# Patient Record
Sex: Female | Born: 1960 | Race: White | Hispanic: No | Smoking: Current some day smoker
Health system: Southern US, Community
[De-identification: ages and names within clinical notes are randomized; demographics above are authoritative.]

## PROBLEM LIST (undated history)

## (undated) DIAGNOSIS — I10 Essential (primary) hypertension: Secondary | ICD-10-CM

## (undated) HISTORY — PX: ABDOMINAL HYSTERECTOMY: SHX81

## (undated) HISTORY — PX: AUGMENTATION MAMMAPLASTY: SUR837

---

## 2005-04-19 ENCOUNTER — Emergency Department: Payer: Self-pay | Admitting: Emergency Medicine

## 2006-04-23 ENCOUNTER — Emergency Department: Payer: Self-pay | Admitting: Unknown Physician Specialty

## 2006-04-24 ENCOUNTER — Emergency Department (HOSPITAL_COMMUNITY): Admission: EM | Admit: 2006-04-24 | Discharge: 2006-04-24 | Payer: Self-pay | Admitting: Emergency Medicine

## 2007-02-22 ENCOUNTER — Emergency Department (HOSPITAL_COMMUNITY): Admission: EM | Admit: 2007-02-22 | Discharge: 2007-02-22 | Payer: Self-pay | Admitting: Emergency Medicine

## 2007-02-22 ENCOUNTER — Emergency Department: Payer: Self-pay | Admitting: Unknown Physician Specialty

## 2007-07-01 ENCOUNTER — Emergency Department: Payer: Self-pay | Admitting: Emergency Medicine

## 2007-09-06 ENCOUNTER — Emergency Department (HOSPITAL_COMMUNITY): Admission: EM | Admit: 2007-09-06 | Discharge: 2007-09-06 | Payer: Self-pay | Admitting: *Deleted

## 2008-04-01 ENCOUNTER — Emergency Department: Payer: Self-pay | Admitting: Emergency Medicine

## 2008-08-04 ENCOUNTER — Ambulatory Visit: Payer: Self-pay | Admitting: Gastroenterology

## 2008-08-24 ENCOUNTER — Ambulatory Visit: Payer: Self-pay | Admitting: Surgery

## 2008-08-28 ENCOUNTER — Ambulatory Visit: Payer: Self-pay | Admitting: Surgery

## 2009-01-21 ENCOUNTER — Ambulatory Visit: Payer: Self-pay | Admitting: Family Medicine

## 2010-02-25 ENCOUNTER — Inpatient Hospital Stay (HOSPITAL_COMMUNITY): Admission: EM | Admit: 2010-02-25 | Discharge: 2010-02-26 | Payer: Self-pay | Source: Home / Self Care

## 2010-05-16 LAB — CBC
Hemoglobin: 11.5 g/dL — ABNORMAL LOW (ref 12.0–15.0)
MCH: 32.8 pg (ref 26.0–34.0)
MCHC: 32.7 g/dL (ref 30.0–36.0)
MCHC: 34.4 g/dL (ref 30.0–36.0)
MCV: 98 fL (ref 78.0–100.0)
Platelets: 223 10*3/uL (ref 150–400)
Platelets: 289 10*3/uL (ref 150–400)
RBC: 3.56 MIL/uL — ABNORMAL LOW (ref 3.87–5.11)
RDW: 12.9 % (ref 11.5–15.5)
RDW: 13.2 % (ref 11.5–15.5)
WBC: 13.5 10*3/uL — ABNORMAL HIGH (ref 4.0–10.5)
WBC: 21.6 10*3/uL — ABNORMAL HIGH (ref 4.0–10.5)

## 2010-05-16 LAB — DIFFERENTIAL
Basophils Absolute: 0 10*3/uL (ref 0.0–0.1)
Basophils Absolute: 0 10*3/uL (ref 0.0–0.1)
Basophils Relative: 0 % (ref 0–1)
Eosinophils Relative: 0 % (ref 0–5)
Lymphs Abs: 2.6 10*3/uL (ref 0.7–4.0)
Monocytes Absolute: 1.8 10*3/uL — ABNORMAL HIGH (ref 0.1–1.0)
Monocytes Relative: 7 % (ref 3–12)
Monocytes Relative: 8 % (ref 3–12)
Neutro Abs: 17.1 10*3/uL — ABNORMAL HIGH (ref 1.7–7.7)
Neutro Abs: 8.6 10*3/uL — ABNORMAL HIGH (ref 1.7–7.7)
Neutrophils Relative %: 64 % (ref 43–77)

## 2010-05-16 LAB — URINALYSIS, ROUTINE W REFLEX MICROSCOPIC
Glucose, UA: NEGATIVE mg/dL
Ketones, ur: NEGATIVE mg/dL
Leukocytes, UA: NEGATIVE
Nitrite: NEGATIVE
Specific Gravity, Urine: 1.023 (ref 1.005–1.030)
Urobilinogen, UA: 0.2 mg/dL (ref 0.0–1.0)

## 2010-05-16 LAB — WOUND CULTURE

## 2010-05-16 LAB — POCT I-STAT, CHEM 8
Creatinine, Ser: 0.9 mg/dL (ref 0.4–1.2)
HCT: 46 % (ref 36.0–46.0)
Hemoglobin: 15.6 g/dL — ABNORMAL HIGH (ref 12.0–15.0)
Potassium: 3.6 mEq/L (ref 3.5–5.1)

## 2010-05-16 LAB — ANAEROBIC CULTURE

## 2010-05-16 LAB — URINE CULTURE: Culture  Setup Time: 201112231402

## 2010-05-16 LAB — POCT CARDIAC MARKERS
CKMB, poc: <1 ng/mL — ABNORMAL LOW (ref 1.0–8.0)
Myoglobin, poc: 50.9 ng/mL (ref 12–200)
Troponin i, poc: <0.05 ng/mL (ref 0.00–0.09)

## 2010-05-16 LAB — URINE MICROSCOPIC-ADD ON

## 2010-07-23 ENCOUNTER — Emergency Department (HOSPITAL_COMMUNITY)
Admission: EM | Admit: 2010-07-23 | Discharge: 2010-07-24 | Discharge status: Against Medical Advice | Payer: PRIVATE HEALTH INSURANCE | Attending: Emergency Medicine | Admitting: Emergency Medicine

## 2010-07-23 DIAGNOSIS — M542 Cervicalgia: Secondary | ICD-10-CM | POA: Insufficient documentation

## 2010-07-23 DIAGNOSIS — M25519 Pain in unspecified shoulder: Secondary | ICD-10-CM | POA: Insufficient documentation

## 2010-07-23 DIAGNOSIS — M549 Dorsalgia, unspecified: Secondary | ICD-10-CM | POA: Insufficient documentation

## 2010-07-23 DIAGNOSIS — R112 Nausea with vomiting, unspecified: Secondary | ICD-10-CM | POA: Insufficient documentation

## 2010-08-09 ENCOUNTER — Emergency Department: Payer: Self-pay | Admitting: Emergency Medicine

## 2010-11-13 ENCOUNTER — Emergency Department (HOSPITAL_COMMUNITY): Payer: PRIVATE HEALTH INSURANCE

## 2010-11-13 ENCOUNTER — Observation Stay (HOSPITAL_COMMUNITY)
Admission: EM | Admit: 2010-11-13 | Discharge: 2010-11-14 | Disposition: A | Discharge status: Home / Self Care | Payer: PRIVATE HEALTH INSURANCE | Attending: Surgery | Admitting: Surgery

## 2010-11-13 DIAGNOSIS — K612 Anorectal abscess: Principal | ICD-10-CM | POA: Insufficient documentation

## 2010-11-13 DIAGNOSIS — D696 Thrombocytopenia, unspecified: Secondary | ICD-10-CM | POA: Insufficient documentation

## 2010-11-13 DIAGNOSIS — F17200 Nicotine dependence, unspecified, uncomplicated: Secondary | ICD-10-CM | POA: Insufficient documentation

## 2010-11-13 LAB — COMPREHENSIVE METABOLIC PANEL
ALT: 19 U/L (ref 0–35)
Alkaline Phosphatase: 107 U/L (ref 39–117)
CO2: 26 mEq/L (ref 19–32)
Creatinine, Ser: 0.64 mg/dL (ref 0.50–1.10)
GFR calc Af Amer: >60 mL/min (ref >60)
GFR calc non Af Amer: >60 mL/min (ref >60)
Glucose, Bld: 87 mg/dL (ref 70–99)
Sodium: 136 mEq/L (ref 135–145)
Total Bilirubin: 0.3 mg/dL (ref 0.3–1.2)

## 2010-11-13 LAB — DIFFERENTIAL
Eosinophils Relative: 0 % (ref 0–5)
Lymphocytes Relative: 9 % — ABNORMAL LOW (ref 12–46)
Lymphs Abs: 1.7 10*3/uL (ref 0.7–4.0)
Monocytes Absolute: 1.2 10*3/uL — ABNORMAL HIGH (ref 0.1–1.0)
Neutro Abs: 15.5 10*3/uL — ABNORMAL HIGH (ref 1.7–7.7)
Neutrophils Relative %: 84 % — ABNORMAL HIGH (ref 43–77)

## 2010-11-13 LAB — URINALYSIS, ROUTINE W REFLEX MICROSCOPIC
Glucose, UA: NEGATIVE mg/dL
Hgb urine dipstick: NEGATIVE
Leukocytes, UA: NEGATIVE
Nitrite: NEGATIVE
Specific Gravity, Urine: 1.009 (ref 1.005–1.030)
Urobilinogen, UA: 0.2 mg/dL (ref 0.0–1.0)

## 2010-11-13 LAB — CBC
MCHC: 34.4 g/dL (ref 30.0–36.0)
MCV: 95.2 fL (ref 78.0–100.0)
Platelets: 137 10*3/uL — ABNORMAL LOW (ref 150–400)
RBC: 4.55 MIL/uL (ref 3.87–5.11)
WBC: 18.4 10*3/uL — ABNORMAL HIGH (ref 4.0–10.5)

## 2010-11-13 LAB — LIPASE, BLOOD: Lipase: 37 U/L (ref 11–59)

## 2010-11-13 LAB — POCT PREGNANCY, URINE: Preg Test, Ur: NEGATIVE

## 2010-11-13 LAB — PROTIME-INR: INR: 0.95 (ref 0.00–1.49)

## 2010-11-13 MED ORDER — IOHEXOL 300 MG/ML  SOLN
100.0000 mL | Freq: Once | INTRAMUSCULAR | Status: AC | PRN
  Administered 2010-11-13: 100 mL via INTRAVENOUS

## 2010-11-21 NOTE — H&P (Signed)
NAMECINDIE, RAJAGOPALAN NO.:  [id number]  MEDICAL RECORD NO.:  [id number]  LOCATION:  1525                         FACILITY:  [facility]  PHYSICIAN:  Sandria Bales. Ezzard Standing, M.D.  DATE OF BIRTH:  24-Jul-1960  DATE OF ADMISSION:  11/13/2010                             HISTORY & PHYSICAL   REFERRING PHYSICIAN:  Glynn Octave, MD  REASON FOR REFERRAL:  Perirectal abscess.  HISTORY OF PRESENT ILLNESS:  This is a 50 year old white female who has no identified primary medical doctor who has had increasing rectal pain.  Per history, she had a stapled hemorrhoidopexy in mid-2011 in [place].  She is unsure of the surgeon who did the surgery.  She has had chronic hemorrhoid problems before then, but has done much better after the hemorrhoidopexy.  She then developed a perirectal abscess, which was drained by Dr. Claud Kelp on February 25, 2010.  She did well with that abscess drainage until about 5 days ago when she had some loose stools.  Over the last 24 hours, she has had increasing rectal pain and discomfort and some tenesmus.  She has no history of peptic ulcer disease, liver disease, pancreatic disease, or colon disease.  Around 2006 or 2007, she was in [place]. They tried to do a colonoscopy on her because of trouble with the hemorrhoids, but she never really completed this and has not had any followup.  She has no history of Crohn disease or colitis.  PAST MEDICAL HISTORY:  She is allergic to CODEINE and HYDROCODONE.  She is not on any medicines right now.  REVIEW OF SYSTEMS:   NEUROLOGIC:  She has had seizures, she is not on any meds right now, she says her last seizure when she was 26, so it was 15 years ago. She does not know the cause of her seizures, she says she was young and stressed.  There has been no specific diagnosis. She is not seeing a neurologist.   CARDIAC:  She has no heart disease, chest pain, or hypertension.   PULMONARY:  She smokes about a  pack of cigarettes a day, knows it is bad for health, and was trying to quit before. She has no chronic lung disease.   GASTROINTESTINAL:  See history of present illness.   UROLOGIC:  No kidney stones or  kidney infections. MUSCULOSKELETAL:  She had some left elbow surgery in August 2011.  She has a cyst on her right elbow.   ENDOCRINE:  She has no history of thyroid or diabetes.  PERSONAL AND SOCIAL HISTORY:  She is accompanied by her husband.  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  She has 2 children, ages 28 and 16.  PHYSICAL EXAMINATION:  VITAL SIGNS:  Her temperature is 99.5, blood pressure 120/79, pulse is 80. GENERAL:  She is a thin, well-nourished white female who is alert, actually does not look that uncomfortable. HEENT:  Unremarkable. NECK:  Supple.  I feel no thyromegaly. LYMPH NODE:  She has no cervical or supraclavicular adenopathy. LUNGS:  Clear to auscultation with symmetric breath sounds. HEART:  Regular rate and rhythm.  I do not hear a murmur. ABDOMEN:  Soft.  She has a scar  in her infraumbilical area.  She says she has had prior ovarian cyst and has undergone at least 1 laparoscopy in the past to look at this, but it has all been for benign disease. RECTUM:  I am able to do a rectal exam, but she has a fullness along her right anterior rectum and is tender, so it really would not be practical to try to drain in the ER.  I think I will have to have her supine to actually get into rectum and look well. NEUROLOGIC:  She is intact to motor and sensory function and has good strength in the upper and lower extremities.  LABORATORY DATA:  She has a white blood count of 18,400, hemoglobin of 14.9, hematocrit 43, a platelet count is 137,000.  Her sodium is 136, potassium 3.9, chloride of 100, CO2 of 26, glucose of 87, BUN 11, creatinine 0.64, lipase is 37.  Her PT was 12.9 with an INR of 0.95. Her urinalysis was negative.  IMPRESSION: 1. Perirectal abscess,  which I think is too far up in her rectal canal     to do at the bedside, so I will take her to the operating room     under general anesthesia.  Interestingly, this is      right beside or adjacent to her staple line for a stapled     hemorrhoidopex.   It is now a second event in the same area, so I do     not know if the staples are acting as a foreign body, causing the     recurrence of abscesses.  I have talked to her that I can try to     find out the surgeon who did her surgery and go back to see them.     I discussed with her the indications, potential complications of     surgery.  Potential complications include but not limited to     bleeding, injury to rectum and recurrence of the abscess. 2. History of seizures, but she is not on active medicine.  She says     she has not had seizures in the last 15 years. 3. Smokes cigarettes, knows it is bad for health. 4. Mild thrombocytopenia on initial CBC.     Sandria Bales. Ezzard Standing, M.D.     DHN/MEDQ  D:  11/13/2010  T:  11/14/2010  Job:  [id number]  Electronically Signed by Ovidio Kin M.D. on 11/21/2010 11:53:42 AM

## 2010-11-21 NOTE — Op Note (Signed)
NAMECLARISSIA, MCKEEN NO.:  [id number]  MEDICAL RECORD NO.:  [id number]  LOCATION:  1525                         FACILITY:  [facility]  PHYSICIAN:  Sandria Bales. Ezzard Standing, M.D.  DATE OF BIRTH:  [birth year]  DATE OF PROCEDURE:  11/13/2010                              OPERATIVE REPORT   PREOPERATIVE DIAGNOSIS:  Perirectal abscess.  POSTOPERATIVE DIAGNOSIS:  Perirectal abscess at staple line of prior hemorrhoid surgery.  PROCEDURE PERFORMED:  Exam under anesthesia with incision and drainage of perirectal abscess, right anterior location.  SURGEON:  Sandria Bales. Ezzard Standing, M.D.  FIRST ASSISTANT:  None.  ANESTHESIA:  General endotracheal.  ESTIMATED BLOOD LOSS:  Minimal.  INDICATION FOR PROCEDURE:  The patient is a 50 year old white female who has no identified primary medical doctor.  She had a stapled sigmoidopexy in 2011 in [place].  She do not know the name of the surgeon who did the surgery.    She presented on 02/25/10 with a perirectal abscess which was incised and drained by Dr. Claud Kelp. She has done well until the last few days when she had again some rectal pain and tenesmus.  She presented to the [facility] Emergency Room where she had a leukocytosis and a CT scan showing a recurrent abscess in the right anterior rectum. (this is a similar location as to the abscess that Dr. Derrell Lolling drained)  I discussed with the patient proceeding with surgery for this abscess. I discussed the indications and potential risk.  The potential risks include bleeding, incontinence, and recurrent abscess.  OPERATIVE NOTE:  The patient was placed in the lithotomy position, supervised by the Dr. Sherrian Divers in room #1, she was given a gram of cefoxitin at the initiation of the procedure.  Her abdomen and perineum was prepped with a Betadine solution and sterilely draped.    A time-out was held and surgical checklist run.  I tried to do a rectal ultrasound to see if I  could visualize the staple line or the abscess, but I never got a good contact with the rectal sidewalls and therefore I was not able to obtain good images.  I then did an anoscopy and saw the staple line, it was about 2 cm above the pectinate line.  At about the 10 o'clock position on the staple line with the patient in lithotomy position, I could see pus coming out at the staple line and this was consistent with the abscess location.  I tried to open this with hemostat, but was not very successfull.  I used my finger to  push into the abscess; she has had somewhat thick scar over the staple line, opened about a centimeter half area and drained the abscess.  I irrigated the wound with saline, it is right now about 10 p.m. I will keep her overnight on intravenous antibiotics with probable plan to discharge tomorrow.  I did remove at least one staple I thought within the cavity, I wondered this was nidus of recurrent infection.  She toleratd the procedure well and was transferred to the recovery room in good condition.   Sandria Bales. Ezzard Standing, M.D., FACS   DHN/MEDQ  D:  11/13/2010  T:  11/14/2010  Job:  [id number]  Electronically Signed by Ovidio Kin M.D. on 11/21/2010 11:48:58 AM

## 2010-12-09 LAB — DIFFERENTIAL
Eosinophils Absolute: 0.2
Lymphs Abs: 3.3
Neutrophils Relative %: 51

## 2010-12-09 LAB — CBC: HCT: 41.9

## 2010-12-26 NOTE — Discharge Summary (Signed)
  NAMEANARA, COWMAN NO.:  [id number]  MEDICAL RECORD NO.:  [id number]  LOCATION:  1525                         FACILITY:  [facility]  PHYSICIAN:  Sandria Bales. Ezzard Standing, M.D.  DATE OF BIRTH:  1960/07/06  DATE OF ADMISSION:  11/13/2010 DATE OF DISCHARGE:  11/14/2010                              DISCHARGE SUMMARY   DISCHARGE DIAGNOSES: 1. Perirectal abscess at staple line of prior procedure for prolapse and hemorrhoids procedure. 2. History of seizures, on no medications. 3. Smokes cigarettes, knows it is bad for health. 4. He has mild thrombocytopenia.  OPERATIONS PERFORMED:  The patient had an exam under anesthesia with incision and drainage of perirectal abscess by Dr. Ovidio Kin on November 13, 2010.  HISTORY OF PRESENT ILLNESS:  Ms. Sieg is a 50 year old white female who has no identified medical doctor.  She has a history of stapled hemorrhoidopexy in mid 2011 in [place].  She is unsure of the surgeon who did the surgery.  She had chronic hemorrhoid problems before the surgery, but has been much better after the hemorrhoid pexy.  However, she developed a perirectal abscess which was drained by Dr. Angelia Mould. Derrell Lolling, on February 25, 2010.  She has done well until about 5 days ago when she had increasing loose stools, and over the last 24 hours, had increasing rectal pain.  A CT scan showed evidence of a recurrent perirectal abscess.  HOSPITAL COURSE:  She was taken to the operating room on the day of admission, where I did an exam under anesthesia with incision and drainage of this perirectal abscess in the right anterior rectum.  This seems to be an identical location where Dr. Derrell Lolling had done the same drainage before.  Postoperatively, she did well.  We did keep her overnight.  On the following day, she was ready for discharge.  I am not sure who was paged by the nursing staff, anyway the patient decided to leave AMA.  DISCHARGE INSTRUCTIONS:   Would include sitz baths, eating a light diet for a day or 2.  Encouraged to follow up with the surgeon who did her primary stapled hemorrhoidopexy in [place] to see if there is any further management issues for this.  Her return appointment thus would be on a p.r.n. basis.   Sandria Bales. Ezzard Standing, M.D., FACS   DHN/MEDQ  D:  12/12/2010  T:  12/12/2010  Job:  [id number]  Electronically Signed by Ovidio Kin M.D. on 12/26/2010 09:48:30 AM

## 2011-01-31 ENCOUNTER — Ambulatory Visit: Payer: Self-pay

## 2011-02-22 ENCOUNTER — Emergency Department: Payer: Self-pay | Admitting: Emergency Medicine

## 2012-02-08 ENCOUNTER — Ambulatory Visit: Payer: Self-pay

## 2014-02-12 ENCOUNTER — Ambulatory Visit: Payer: Self-pay | Admitting: Family

## 2014-05-12 ENCOUNTER — Emergency Department: Payer: Self-pay | Admitting: Emergency Medicine

## 2015-09-26 ENCOUNTER — Encounter: Payer: Self-pay | Admitting: Emergency Medicine

## 2015-09-26 ENCOUNTER — Emergency Department: Payer: PRIVATE HEALTH INSURANCE

## 2015-09-26 ENCOUNTER — Emergency Department
Admission: EM | Admit: 2015-09-26 | Discharge: 2015-09-26 | Disposition: A | Discharge status: Home / Self Care | Payer: PRIVATE HEALTH INSURANCE | Attending: Emergency Medicine | Admitting: Emergency Medicine

## 2015-09-26 DIAGNOSIS — F17200 Nicotine dependence, unspecified, uncomplicated: Secondary | ICD-10-CM | POA: Diagnosis not present

## 2015-09-26 DIAGNOSIS — M5440 Lumbago with sciatica, unspecified side: Secondary | ICD-10-CM | POA: Diagnosis not present

## 2015-09-26 DIAGNOSIS — M545 Low back pain: Secondary | ICD-10-CM | POA: Diagnosis present

## 2015-09-26 DIAGNOSIS — M5430 Sciatica, unspecified side: Secondary | ICD-10-CM

## 2015-09-26 LAB — URINALYSIS COMPLETE WITH MICROSCOPIC (ARMC ONLY)
BILIRUBIN URINE: NEGATIVE
Bacteria, UA: NONE SEEN
GLUCOSE, UA: NEGATIVE mg/dL
Ketones, ur: NEGATIVE mg/dL
Leukocytes, UA: NEGATIVE
NITRITE: NEGATIVE
Protein, ur: NEGATIVE mg/dL
RBC / HPF: NONE SEEN RBC/hpf (ref 0–5)
SPECIFIC GRAVITY, URINE: 1.004 — AB (ref 1.005–1.030)
Squamous Epithelial / LPF: NONE SEEN
pH: 6 (ref 5.0–8.0)

## 2015-09-26 MED ORDER — CYCLOBENZAPRINE HCL 10 MG PO TABS: 10.0000 mg | ORAL_TABLET | Freq: Three times a day (TID) | ORAL | 0 refills | Status: DC | PRN

## 2015-09-26 MED ORDER — OXYCODONE-ACETAMINOPHEN 5-325 MG PO TABS: 1.0000 | ORAL_TABLET | ORAL | 0 refills | Status: DC | PRN

## 2015-09-26 MED ORDER — GABAPENTIN 100 MG PO CAPS: 100.0000 mg | ORAL_CAPSULE | Freq: Three times a day (TID) | ORAL | 0 refills | Status: AC

## 2015-09-26 MED ORDER — KETOROLAC TROMETHAMINE 60 MG/2ML IM SOLN
60.0000 mg | Freq: Once | INTRAMUSCULAR | Status: AC
  Administered 2015-09-26: 60 mg via INTRAMUSCULAR
  Filled 2015-09-26: qty 2

## 2015-09-26 NOTE — ED Provider Notes (Signed)
[facility] Emergency Department Provider Note   ____________________________________________  Time seen: Approximately 10:35 AM  I have reviewed the triage vital signs and the nursing notes.   HISTORY  Chief Complaint Back Pain    HPI Janice Collier is a 55 y.o. female who presents for evaluation of severe low back pain. Patient states that initially the pain started several years ago and has usually been relieved with ibuprofen over-the-counter and rest. She reports that over the past few days the symptoms have progressively worsened with no recent onset of injury. Patient states that [facility] had x-rays and sent home with a prescription for Ultram, prednisone, baclofen. Patient states that her pain is the same now worse than it was prior to being seen on Friday. States she was unable to tolerating the medications felt like a way too strong for her.   History reviewed. No pertinent past medical history.  There are no active problems to display for this patient.   Past Surgical History:  Procedure Laterality Date  . ABDOMINAL HYSTERECTOMY        Allergies Codeine and Hydrocodone  No family history on file.  Social History Social History  Substance Use Topics  . Smoking status: Current Some Day Smoker  . Smokeless tobacco: Current User  . Alcohol use No    Review of Systems Constitutional: No fever/chills Cardiovascular: Denies chest pain. Respiratory: Denies shortness of breath. Gastrointestinal: No abdominal pain.  No nausea, no vomiting.  No diarrhea.  No constipation. Genitourinary: Negative for dysuria. Musculoskeletal: Positive for low back pain. Skin: Negative for rash. Neurological: Negative for headaches, focal weakness or numbness.  10-point ROS otherwise negative.  ____________________________________________   PHYSICAL EXAM:  VITAL SIGNS: ED Triage Vitals  Enc Vitals Group       BP 09/26/15 1006 119/83     Pulse Rate 09/26/15 1006 76     Resp 09/26/15 1006 20     Temp 09/26/15 1006 98.4 F (36.9 C)     Temp Source 09/26/15 1006 Oral     SpO2 09/26/15 1006 99 %     Weight 09/26/15 1006 110 lb (49.9 kg)     Height 09/26/15 1006  (1.549 m)     Head Circumference --      Peak Flow --      Pain Score 09/26/15 1009 8     Pain Loc --      Pain Edu? --      Excl. in GC? --     Constitutional: Alert and oriented. Well appearing and in no acute distress. Neck: No stridor.Supple, full range of motion nontender.   Cardiovascular: Normal rate, regular rhythm. Grossly normal heart sounds.  Good peripheral circulation. Respiratory: Normal respiratory effort.  No retractions. Lungs CTAB. Gastrointestinal: Soft and nontender. No distention. No abdominal bruits. No CVA tenderness. Musculoskeletal: No lower extremity tenderness nor edema.  No joint effusions. Neurologic:  Normal speech and language. No gross focal neurologic deficits are appreciated. No gait instability. Skin:  Skin is warm, dry and intact. No rash noted. Psychiatric: Mood and affect are normal. Speech and behavior are normal.  ____________________________________________   LABS (all labs ordered are listed, but only abnormal results are displayed)  Labs Reviewed  URINALYSIS COMPLETEWITH MICROSCOPIC ([facility] ONLY) - Abnormal; Notable for the following:       Result Value   Color, Urine STRAW (*)    APPearance CLEAR (*)  Specific Gravity, Urine 1.004 (*)    Hgb urine dipstick 1+ (*)    All other components within normal limits   ____________________________________________  EKG   ____________________________________________  RADIOLOGY  IMPRESSION: 1. Lateral disc bulge at the L3-4 level, combined with mild degenerative facet arthropathy, appears to cause a moderate left neural foramen narrowing with possible associated nerve root impingement. If patient's radiculopathic symptoms  are left-sided, would consider nonemergent MRI of the lumbar spine for further characterization. 2. No acute findings.  Mild scoliosis. 3. Aortic atherosclerosis. Electronically Signed   By: Bary Richard M.D.   On: 09/26/2015 11:41 ____________________________________________   PROCEDURES  Procedure(s) performed: None  Procedures  Critical Care performed: No  ____________________________________________   INITIAL IMPRESSION / ASSESSMENT AND PLAN / ED COURSE  Pertinent labs & imaging results that were available during my care of the patient were reviewed by me and considered in my medical decision making (see chart for details).  Recurrent lumbar back pain. With possible neuropathy secondary to impingement. She discharged home with prescription for gabapentin 100 mg 3 times a day, Flexeril 5 mg 3 times a day. Percocet 5/325 as needed for severe breakthrough pain only. Patient follow-up with PCP for an MRI. Return to ER as needed. ____________________________________________   FINAL CLINICAL IMPRESSION(S) / ED DIAGNOSES  Final diagnoses:  Low back pain with sciatica, sciatica laterality unspecified, unspecified back pain laterality  Sciatica, unspecified laterality      NEW MEDICATIONS STARTED DURING THIS VISIT:  Discharge Medication List as of 09/26/2015 12:10 PM    START taking these medications   Details  cyclobenzaprine (FLEXERIL) 10 MG tablet Take 1 tablet (10 mg total) by mouth 3 (three) times daily as needed for muscle spasms., Starting Sun 09/26/2015, Print    gabapentin (NEURONTIN) 100 MG capsule Take 1 capsule (100 mg total) by mouth 3 (three) times daily., Starting Sun 09/26/2015, Until Mon 09/25/2016, Print    oxyCODONE-acetaminophen (ROXICET) 5-325 MG tablet Take 1-2 tablets by mouth every 4 (four) hours as needed for severe pain., Starting Sun 09/26/2015, Print         Note:  This document was prepared using Dragon voice recognition software and may  include unintentional dictation errors.   Evangeline Dakin, PA-C 09/26/15 1359    Governor Rooks, MD 09/26/15 [phone]

## 2015-09-26 NOTE — ED Triage Notes (Signed)
Pt presents with low back pain radiating down into leg for two weeks worse last night.

## 2015-09-26 NOTE — ED Notes (Signed)
Pt reports low back and buttocks pain radiating down legs.  This has been going on for the past 2 weeks. Pt also c/o pressure near her bladder but states she has been drinking a lot of water and does not have any pain with urination.

## 2015-09-26 NOTE — ED Notes (Signed)

## 2015-10-01 ENCOUNTER — Other Ambulatory Visit: Payer: Self-pay | Admitting: Student

## 2015-10-01 DIAGNOSIS — M5416 Radiculopathy, lumbar region: Secondary | ICD-10-CM

## 2015-10-01 DIAGNOSIS — M47816 Spondylosis without myelopathy or radiculopathy, lumbar region: Secondary | ICD-10-CM

## 2015-10-14 ENCOUNTER — Ambulatory Visit
Admission: RE | Admit: 2015-10-14 | Discharge: 2015-10-14 | Disposition: A | Discharge status: Home / Self Care | Payer: No Typology Code available for payment source | Source: Ambulatory Visit | Attending: Student | Admitting: Student

## 2015-10-14 DIAGNOSIS — M5416 Radiculopathy, lumbar region: Secondary | ICD-10-CM

## 2015-10-14 DIAGNOSIS — M5126 Other intervertebral disc displacement, lumbar region: Secondary | ICD-10-CM | POA: Insufficient documentation

## 2015-10-14 DIAGNOSIS — M5136 Other intervertebral disc degeneration, lumbar region: Secondary | ICD-10-CM | POA: Insufficient documentation

## 2015-10-14 DIAGNOSIS — M47816 Spondylosis without myelopathy or radiculopathy, lumbar region: Secondary | ICD-10-CM

## 2015-12-15 ENCOUNTER — Other Ambulatory Visit: Payer: Self-pay | Admitting: Physician Assistant

## 2015-12-15 DIAGNOSIS — Z1231 Encounter for screening mammogram for malignant neoplasm of breast: Secondary | ICD-10-CM

## 2015-12-17 ENCOUNTER — Other Ambulatory Visit: Payer: Self-pay | Admitting: Physician Assistant

## 2015-12-17 ENCOUNTER — Ambulatory Visit
Admission: RE | Admit: 2015-12-17 | Discharge: 2015-12-17 | Disposition: A | Discharge status: Home / Self Care | Payer: No Typology Code available for payment source | Source: Ambulatory Visit | Attending: Physician Assistant | Admitting: Physician Assistant

## 2015-12-17 DIAGNOSIS — Z1231 Encounter for screening mammogram for malignant neoplasm of breast: Secondary | ICD-10-CM

## 2016-12-17 ENCOUNTER — Other Ambulatory Visit: Payer: Self-pay | Admitting: Physician Assistant

## 2016-12-17 DIAGNOSIS — Z1231 Encounter for screening mammogram for malignant neoplasm of breast: Secondary | ICD-10-CM

## 2016-12-20 ENCOUNTER — Other Ambulatory Visit: Payer: Self-pay | Admitting: Physician Assistant

## 2016-12-20 ENCOUNTER — Ambulatory Visit
Admission: RE | Admit: 2016-12-20 | Discharge: 2016-12-20 | Disposition: A | Discharge status: Home / Self Care | Payer: PRIVATE HEALTH INSURANCE | Source: Ambulatory Visit | Attending: Physician Assistant | Admitting: Physician Assistant

## 2016-12-20 DIAGNOSIS — Z1231 Encounter for screening mammogram for malignant neoplasm of breast: Secondary | ICD-10-CM | POA: Insufficient documentation

## 2017-12-07 ENCOUNTER — Emergency Department: Payer: PRIVATE HEALTH INSURANCE

## 2017-12-07 ENCOUNTER — Other Ambulatory Visit: Payer: Self-pay

## 2017-12-07 ENCOUNTER — Encounter: Payer: Self-pay | Admitting: Emergency Medicine

## 2017-12-07 ENCOUNTER — Emergency Department
Admission: EM | Admit: 2017-12-07 | Discharge: 2017-12-07 | Disposition: A | Discharge status: Home / Self Care | Payer: PRIVATE HEALTH INSURANCE | Attending: Emergency Medicine | Admitting: Emergency Medicine

## 2017-12-07 DIAGNOSIS — I10 Essential (primary) hypertension: Secondary | ICD-10-CM | POA: Insufficient documentation

## 2017-12-07 DIAGNOSIS — R42 Dizziness and giddiness: Secondary | ICD-10-CM | POA: Diagnosis present

## 2017-12-07 DIAGNOSIS — F17200 Nicotine dependence, unspecified, uncomplicated: Secondary | ICD-10-CM | POA: Insufficient documentation

## 2017-12-07 LAB — BASIC METABOLIC PANEL
Anion gap: 7 (ref 5–15)
BUN: 14 mg/dL (ref 6–20)
CALCIUM: 9.2 mg/dL (ref 8.9–10.3)
CHLORIDE: 108 mmol/L (ref 98–111)
CO2: 24 mmol/L (ref 22–32)
CREATININE: 0.55 mg/dL (ref 0.44–1.00)
Glucose, Bld: 93 mg/dL (ref 70–99)
Potassium: 4.1 mmol/L (ref 3.5–5.1)
SODIUM: 139 mmol/L (ref 135–145)

## 2017-12-07 LAB — CBC
HEMATOCRIT: 45.6 % (ref 35.0–47.0)
Hemoglobin: 15 g/dL (ref 12.0–16.0)
MCH: 31 pg (ref 26.0–34.0)
MCHC: 33 g/dL (ref 32.0–36.0)
MCV: 94 fL (ref 80.0–100.0)
Platelets: 273 10*3/uL (ref 150–440)
RBC: 4.85 MIL/uL (ref 3.80–5.20)
RDW: 14.1 % (ref 11.5–14.5)
WBC: 7.8 10*3/uL (ref 3.6–11.0)

## 2017-12-07 LAB — URINALYSIS, COMPLETE (UACMP) WITH MICROSCOPIC
BACTERIA UA: NONE SEEN
BILIRUBIN URINE: NEGATIVE
Glucose, UA: NEGATIVE mg/dL
KETONES UR: NEGATIVE mg/dL
Leukocytes, UA: NEGATIVE
NITRITE: NEGATIVE
PROTEIN: NEGATIVE mg/dL
SQUAMOUS EPITHELIAL / LPF: NONE SEEN (ref 0–5)
Specific Gravity, Urine: 1.002 — ABNORMAL LOW (ref 1.005–1.030)
pH: 8 (ref 5.0–8.0)

## 2017-12-07 LAB — TROPONIN I: Troponin I: <0.03 ng/mL (ref <0.03)

## 2017-12-07 MED ORDER — MECLIZINE HCL 25 MG PO TABS
25.0000 mg | ORAL_TABLET | Freq: Once | ORAL | Status: AC
  Administered 2017-12-07: 25 mg via ORAL
  Filled 2017-12-07: qty 1

## 2017-12-07 MED ORDER — IBUPROFEN 800 MG PO TABS
800.0000 mg | ORAL_TABLET | Freq: Once | ORAL | Status: AC
  Administered 2017-12-07: 800 mg via ORAL
  Filled 2017-12-07: qty 1

## 2017-12-07 MED ORDER — HYDROCHLOROTHIAZIDE 25 MG PO TABS: 25.0000 mg | ORAL_TABLET | Freq: Every day | ORAL | 0 refills | Status: AC

## 2017-12-07 MED ORDER — ONDANSETRON HCL 4 MG PO TABS: 4.0000 mg | ORAL_TABLET | Freq: Three times a day (TID) | ORAL | 0 refills | Status: DC | PRN

## 2017-12-07 MED ORDER — MECLIZINE HCL 25 MG PO TABS: 25.0000 mg | ORAL_TABLET | Freq: Three times a day (TID) | ORAL | 0 refills | Status: DC | PRN

## 2017-12-07 MED ORDER — ONDANSETRON 4 MG PO TBDP
4.0000 mg | ORAL_TABLET | Freq: Once | ORAL | Status: AC
  Administered 2017-12-07: 4 mg via ORAL
  Filled 2017-12-07: qty 1

## 2017-12-07 MED ORDER — HYDROCHLOROTHIAZIDE 25 MG PO TABS
25.0000 mg | ORAL_TABLET | Freq: Once | ORAL | Status: AC
  Administered 2017-12-07: 25 mg via ORAL
  Filled 2017-12-07: qty 1

## 2017-12-07 NOTE — ED Notes (Signed)
EDP aware of patient high blood pressure
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
## 2017-12-07 NOTE — ED Notes (Signed)
Pt alert and oriented X4, active, cooperative, pt in NAD. RR even and unlabored, color WNL.  Pt informed to return if any life threatening symptoms occur.  Discharge and followup instructions reviewed. Pt ambulates safely. 

## 2017-12-07 NOTE — ED Provider Notes (Signed)
[facility] Emergency Department Provider Note ____________________________________________   I have reviewed the triage vital signs and the triage nursing note.  HISTORY  Chief Complaint Dizziness   Historian Patient  HPI Janice Collier is a 57 y.o. female presents with about 3-day history of dizziness which she describes as room spinning worse with positional changes especially when she gets up from lying down.  She never had this before.  No ear pain.  Initially told me no headaches, and then during the ED stay complained of headache.  Positive for nausea.  Today she had an episode of room spinning while she was driving and went to the fire department and then came to the emergency department for evaluation.  Symptoms are moderate to severe when they occur.  States that occasionally she feels like her vision is blurry.  No weakness or numbness.     History reviewed. No pertinent past medical history.  There are no active problems to display for this patient.   Past medical history, hysterectomy  Prior to Admission medications   Medication Sig Start Date End Date Taking? Authorizing Provider  cyclobenzaprine (FLEXERIL) 10 MG tablet Take 1 tablet (10 mg total) by mouth 3 (three) times daily as needed for muscle spasms. 09/26/15   Beers, Charmayne Sheer, PA-C  gabapentin (NEURONTIN) 100 MG capsule Take 1 capsule (100 mg total) by mouth 3 (three) times daily. 09/26/15 09/25/16  Beers, Charmayne Sheer, PA-C  hydrochlorothiazide (HYDRODIURIL) 25 MG tablet Take 1 tablet (25 mg total) by mouth daily. 12/07/17   Governor Rooks, MD  meclizine (ANTIVERT) 25 MG tablet Take 1 tablet (25 mg total) by mouth 3 (three) times daily as needed for dizziness or nausea. 12/07/17   Governor Rooks, MD  ondansetron (ZOFRAN) 4 MG tablet Take 1 tablet (4 mg total) by mouth every 8 (eight) hours as needed for nausea or vomiting. 12/07/17   Governor Rooks, MD  oxyCODONE-acetaminophen (ROXICET)  5-325 MG tablet Take 1-2 tablets by mouth every 4 (four) hours as needed for severe pain. 09/26/15   Beers, Charmayne Sheer, PA-C    Allergies  Allergen Reactions  . Baclofen Diarrhea and Itching  . Codeine Itching  . Hydrocodone Itching  . Prednisone Other (See Comments)    "felt drunk"  . Tramadol Itching    Family History  Problem Relation Age of Onset  . Breast cancer Neg Hx     Social History Social History   Tobacco Use  . Smoking status: Current Some Day Smoker  . Smokeless tobacco: Current User  Substance Use Topics  . Alcohol use: No  . Drug use: Not on file    Review of Systems  Constitutional: Negative for fever. Eyes: Negative for visual changes. ENT: Negative for sore throat. Cardiovascular: Negative for chest pain. Respiratory: Negative for shortness of breath. Gastrointestinal: Negative for abdominal pain, vomiting and diarrhea. Genitourinary: Negative for dysuria. Musculoskeletal: Negative for back pain. Skin: Negative for rash. Neurological: Positive for occasional headache.  ____________________________________________   PHYSICAL EXAM:  VITAL SIGNS: ED Triage Vitals  Enc Vitals Group     BP 12/07/17 1351 (!) 156/102     Pulse Rate 12/07/17 1351 64     Resp 12/07/17 1351 14     Temp --      Temp src --      SpO2 12/07/17 1351 100 %     Weight 12/07/17 1132 111 lb (50.3 kg)     Height 12/07/17 1132 5\' 1"  (1.549 m)  Head Circumference --      Peak Flow --      Pain Score 12/07/17 1130 0     Pain Loc --      Pain Edu? --      Excl. in GC? --      Constitutional: Alert and oriented.  Somewhat anxious. HEENT      Head: Normocephalic and atraumatic.      Eyes: Conjunctivae are normal. Pupils equal and round.       Ears:   TM normal bilaterally      Nose: No congestion/rhinnorhea.      Mouth/Throat: Mucous membranes are moist.      Neck: No stridor. Cardiovascular/Chest: Normal rate, regular rhythm.  No murmurs, rubs, or  gallops. Respiratory: Normal respiratory effort without tachypnea nor retractions. Breath sounds are clear and equal bilaterally. No wheezes/rales/rhonchi. Gastrointestinal: Soft. No distention, no guarding, no rebound. Nontender.  Genitourinary/rectal:Deferred Musculoskeletal: Nontender with normal range of motion in all extremities. No joint effusions.  No lower extremity tenderness.  No edema. Neurologic: No facial droop.  Normal speech and language. No gross or focal neurologic deficits are appreciated.  Out of 5 strength in 4 extremities.  Coordination intact. Skin:  Skin is warm, dry and intact. No rash noted. Psychiatric: Mood and affect are normal. Speech and behavior are normal. Patient exhibits appropriate insight and judgment.   ____________________________________________  LABS (pertinent positives/negatives) I, Governor Rooks, MD the attending physician have reviewed the labs noted below.  Labs Reviewed  URINALYSIS, COMPLETE (UACMP) WITH MICROSCOPIC - Abnormal; Notable for the following components:      Result Value   Color, Urine COLORLESS (*)    APPearance CLEAR (*)    Specific Gravity, Urine 1.002 (*)    Hgb urine dipstick SMALL (*)    All other components within normal limits  BASIC METABOLIC PANEL  CBC  TROPONIN I    ____________________________________________    EKG I, Governor Rooks, MD, the attending physician have personally viewed and interpreted all ECGs.  61 bpm.   normal sinus rhythm.  Narrow QS per normal axis.  Normal ST and T wave ____________________________________________  RADIOLOGY   CT head without contrast: IMPRESSION: Normal head CT __________________________________________  PROCEDURES  Procedure(s) performed: None  Procedures  Critical Care performed: None   ____________________________________________  ED COURSE / ASSESSMENT AND PLAN  Pertinent labs & imaging results that were available during my care of the patient were  reviewed by me and considered in my medical decision making (see chart for details).    Symptoms seem most consistent with vertigo.  Patient does not have a history of hypertension has been hypertensive here, which we discussed starting very low-dose hydrochlorothiazide versus follow-up as an outpatient.  We will go ahead and start low-dose Hadeco thiazide.  Initially patient was not complaining of headache or any focal neurologic deficit, however then she developed a headache and stated that she was feeling lightheaded as well.  At this point we discussed risk and benefit of head CT and chose to proceed.  This seems unlikely to be stroke more likely to be benign peripheral vertigo.  Laboratory studies are reassuring in terms of normal electrolytes, no anemia.   CT head reassuring.  Think stroke would be highly unlikely I am not recommending further MRI imaging at this point time.  We will treat for most consistent with vertigo.    CONSULTATIONS:   None   Patient / Family / Caregiver informed of clinical course, medical decision-making  process, and agree with plan.   I discussed return precautions, follow-up instructions, and discharge instructions with patient and/or family.  Discharge Instructions : You are evaluated for dizziness or room spinning, and as we discussed I am most suspicious of vertigo.  If not better in 1 week, make an appoint with ENT.  You are found to have elevated blood pressure here and are being started on a very low-dose blood pressure medication.  Follow-up with your primary care doctor within 1 week.  Return to emergency department immediately for any worsening condition including weakness, numbness, confusion or altered mental status, no worsening headache, chest pain, or dizziness or passing out or any other symptoms concerning to you.    ___________________________________________   FINAL CLINICAL IMPRESSION(S) / ED DIAGNOSES   Final diagnoses:   Vertigo  Hypertension, unspecified type      ___________________________________________         Note: This dictation was prepared with Dragon dictation. Any transcriptional errors that result from this process are unintentional    Governor Rooks, MD 12/07/17 [phone]

## 2017-12-07 NOTE — ED Notes (Signed)
Crackers and water provided to pt while in sub-wait per Dr. Darnelle Catalan.

## 2017-12-07 NOTE — ED Triage Notes (Signed)
Pt in via [facility] with c/o dizziness, vertigo, nausea and sensitivity to light. EMS reports pt only hx is epilepsy with last episode when she was 35. EMS reports sx's for the last 3 days worsening today while she was driving so she pulled over and called 911.

## 2017-12-07 NOTE — ED Notes (Signed)
ED Provider at bedside. 

## 2017-12-07 NOTE — ED Notes (Signed)
Pt ambulated to bathroom 

## 2017-12-07 NOTE — Discharge Instructions (Signed)
You are evaluated for dizziness or room spinning, and as we discussed I am most suspicious of vertigo.  If not better in 1 week, make an appoint with ENT.  You are found to have elevated blood pressure here and are being started on a very low-dose blood pressure medication.  Follow-up with your primary care doctor within 1 week.  Return to emergency department immediately for any worsening condition including weakness, numbness, confusion or altered mental status, no worsening headache, chest pain, or dizziness or passing out or any other symptoms concerning to you.

## 2017-12-24 ENCOUNTER — Other Ambulatory Visit: Payer: Self-pay | Admitting: Physician Assistant

## 2017-12-24 ENCOUNTER — Other Ambulatory Visit: Payer: Self-pay | Admitting: Acute Care

## 2017-12-24 DIAGNOSIS — R42 Dizziness and giddiness: Secondary | ICD-10-CM

## 2017-12-24 DIAGNOSIS — Z1231 Encounter for screening mammogram for malignant neoplasm of breast: Secondary | ICD-10-CM

## 2017-12-25 ENCOUNTER — Ambulatory Visit
Admission: RE | Admit: 2017-12-25 | Discharge: 2017-12-25 | Disposition: A | Discharge status: Home / Self Care | Payer: No Typology Code available for payment source | Source: Ambulatory Visit | Attending: Physician Assistant | Admitting: Physician Assistant

## 2017-12-25 DIAGNOSIS — Z1231 Encounter for screening mammogram for malignant neoplasm of breast: Secondary | ICD-10-CM | POA: Diagnosis present

## 2018-01-10 ENCOUNTER — Ambulatory Visit
Admission: RE | Admit: 2018-01-10 | Discharge: 2018-01-10 | Disposition: A | Discharge status: Home / Self Care | Payer: No Typology Code available for payment source | Source: Ambulatory Visit | Attending: Acute Care | Admitting: Acute Care

## 2018-01-10 DIAGNOSIS — R42 Dizziness and giddiness: Secondary | ICD-10-CM | POA: Insufficient documentation

## 2018-01-10 MED ORDER — GADOBUTROL 1 MMOL/ML IV SOLN
5.0000 mL | Freq: Once | INTRAVENOUS | Status: AC | PRN
  Administered 2018-01-10: 5 mL via INTRAVENOUS

## 2018-09-11 ENCOUNTER — Other Ambulatory Visit: Payer: Self-pay | Admitting: Physician Assistant

## 2018-09-11 DIAGNOSIS — H9202 Otalgia, left ear: Secondary | ICD-10-CM

## 2018-09-16 ENCOUNTER — Other Ambulatory Visit: Payer: Self-pay

## 2018-09-16 ENCOUNTER — Ambulatory Visit
Admission: RE | Admit: 2018-09-16 | Discharge: 2018-09-16 | Disposition: A | Discharge status: Home / Self Care | Payer: PRIVATE HEALTH INSURANCE | Source: Ambulatory Visit | Attending: Physician Assistant | Admitting: Physician Assistant

## 2018-09-16 DIAGNOSIS — H9202 Otalgia, left ear: Secondary | ICD-10-CM | POA: Diagnosis not present

## 2018-09-16 HISTORY — DX: Essential (primary) hypertension: I10

## 2018-09-16 LAB — POCT I-STAT CREATININE: Creatinine, Ser: 0.7 mg/dL (ref 0.44–1.00)

## 2018-09-16 MED ORDER — IOHEXOL 300 MG/ML  SOLN
75.0000 mL | Freq: Once | INTRAMUSCULAR | Status: AC | PRN
  Administered 2018-09-16: 75 mL via INTRAVENOUS

## 2018-12-02 ENCOUNTER — Other Ambulatory Visit: Payer: Self-pay | Admitting: Student

## 2018-12-02 DIAGNOSIS — Z1231 Encounter for screening mammogram for malignant neoplasm of breast: Secondary | ICD-10-CM

## 2018-12-10 ENCOUNTER — Ambulatory Visit
Admission: RE | Admit: 2018-12-10 | Discharge: 2018-12-10 | Disposition: A | Discharge status: Home / Self Care | Payer: PRIVATE HEALTH INSURANCE | Source: Ambulatory Visit | Attending: Student | Admitting: Student

## 2018-12-10 DIAGNOSIS — Z1231 Encounter for screening mammogram for malignant neoplasm of breast: Secondary | ICD-10-CM | POA: Diagnosis not present

## 2019-03-27 IMAGING — MR MR HEAD WO/W CM
12 series · 48 of 48 positions shown · IV contrast (gadavist)
Comparison: 12/07/2017 head CT

CLINICAL DATA: Vertigo and dizziness for 1 month.

EXAM:
MRI HEAD WITHOUT AND WITH CONTRAST
TECHNIQUE: Multiplanar, multiecho pulse sequences of the brain and surrounding
structures were obtained without and with intravenous contrast.
CONTRAST:  5 cc Gadavist intravenous

[Series 2: T1 · sagittal · 5.0mm · 0.45mm/px · 1 of 23 slices shown (1 of 2)]
[im 1/23]
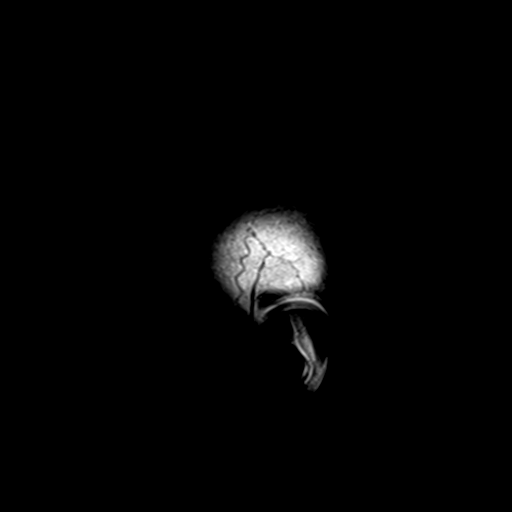

[Series 4: DWI · axial · 3.0mm · 1.20mm/px · z∈[-104,+58]mm · 3 of 55 slices shown (1 of 4)]
[im 1/55]
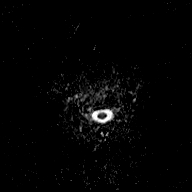
[im 28/55]
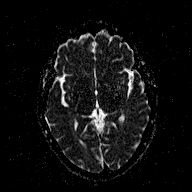
[im 55/55]
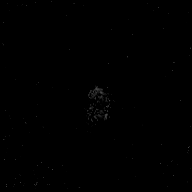

[Series 5: T2 · axial · 5.0mm · 0.72mm/px · z∈[-100,+54]mm · 2 of 23 slices shown (1 of 2)]
[im 1/23]
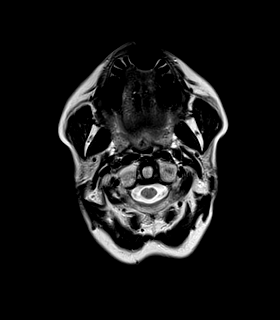
[im 23/23]
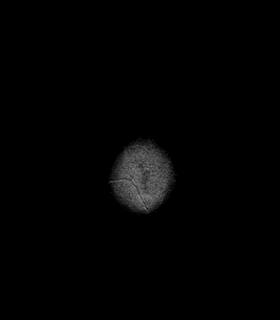

[Series 6: FLAIR · axial · 3.0mm · 0.45mm/px · z∈[-104,+58]mm · 4 of 55 slices shown]
[im 1/55]
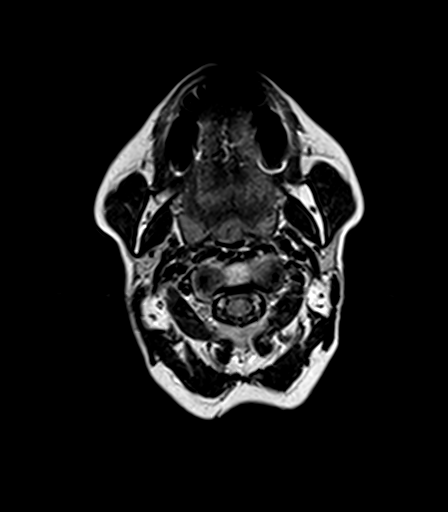
[im 19/55]
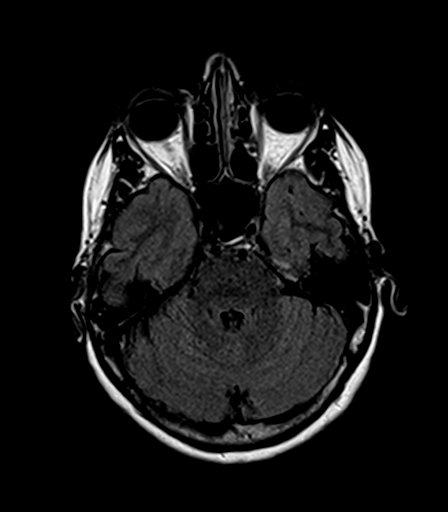
[im 37/55]
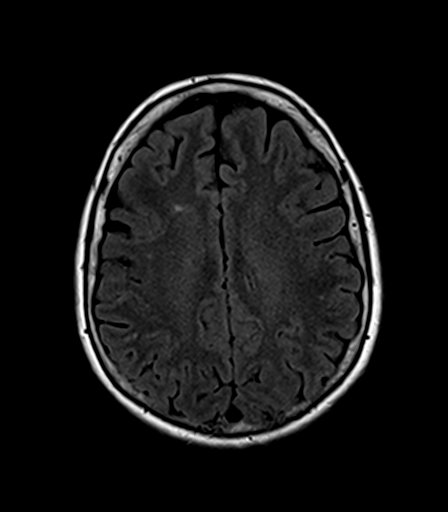
[im 55/55]
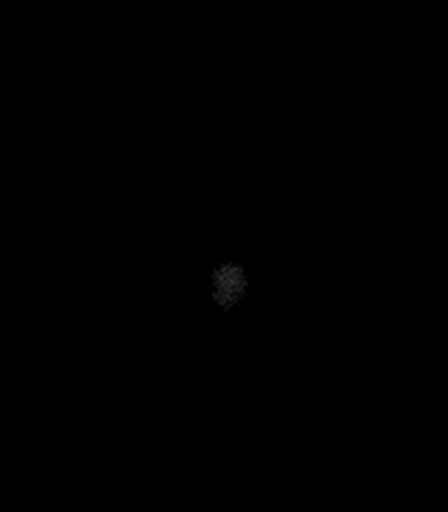

[Series 7: T2 · axial · 5.0mm · 0.72mm/px · z∈[-100,+54]mm · 2 of 23 slices shown (2 of 2)]
[im 1/23]
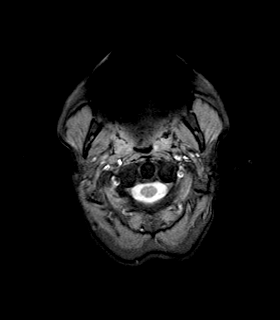
[im 23/23]
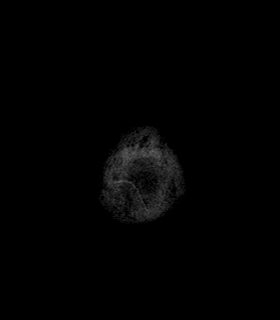

[Series 8: T1 · axial · 1.0mm · 1.00mm/px · z∈[-103,+56]mm · 11 of 160 slices shown (2 of 2)]
[im 1/160]
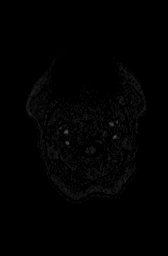
[im 16/160]
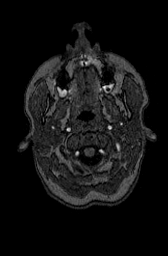
[im 32/160]
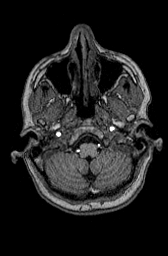
[im 48/160]
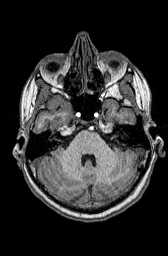
[im 64/160]
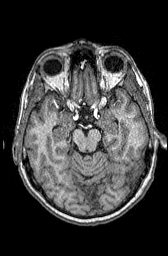
[im 80/160]
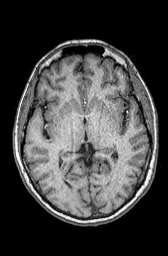
[im 96/160]
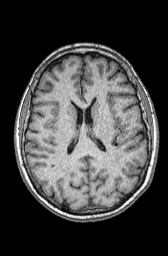
[im 112/160]
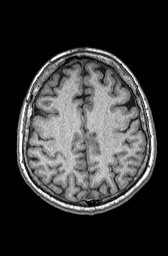
[im 128/160]
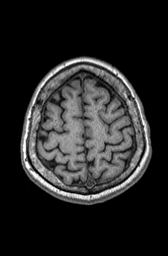
[im 144/160]
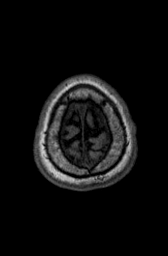
[im 160/160]
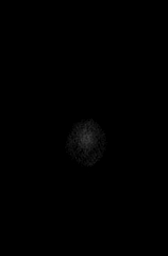

[Series 10: DWI · coronal · 3.0mm · 1.15mm/px · 3 of 45 slices shown (2 of 4)]
[im 1/45]
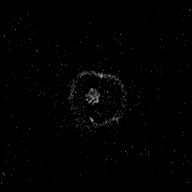
[im 23/45]
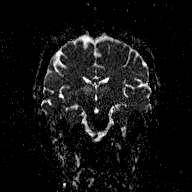
[im 45/45]
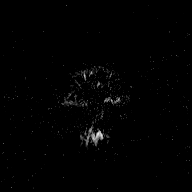

[Series 11: T2 post-contrast · coronal · 5.0mm · 0.43mm/px · 2 of 28 slices shown]
[im 1/28]
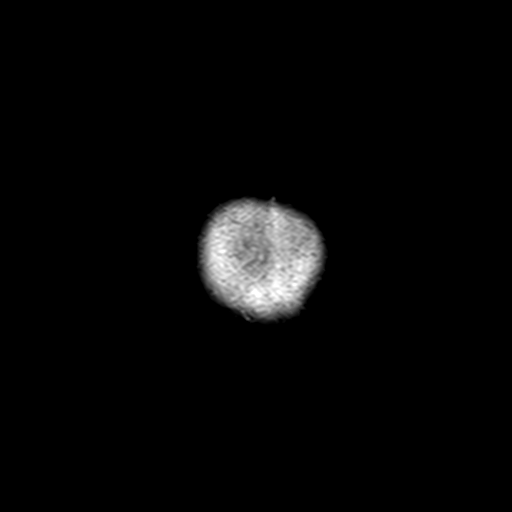
[im 28/28]
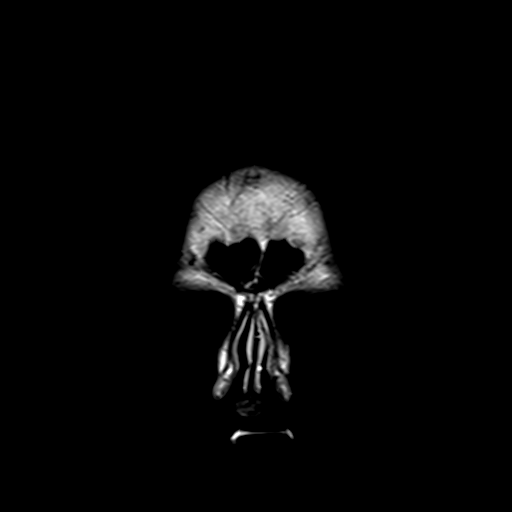

[Series 12: T1 post-contrast · axial · 1.0mm · 1.00mm/px · z∈[-103,+56]mm · 11 of 160 slices shown (1 of 2)]
[im 1/160]
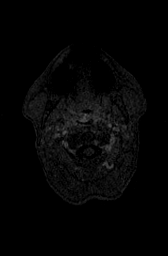
[im 16/160]
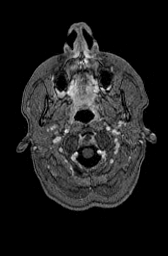
[im 32/160]
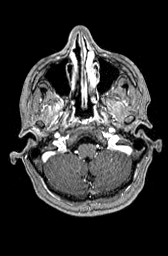
[im 48/160]
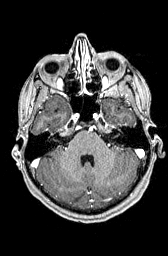
[im 64/160]
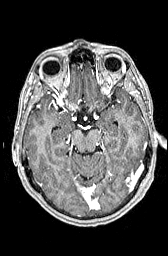
[im 80/160]
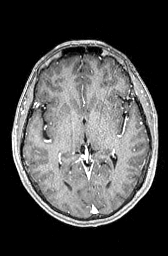
[im 96/160]
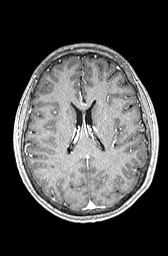
[im 112/160]
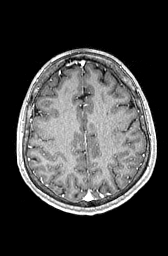
[im 128/160]
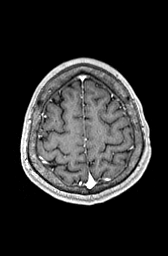
[im 144/160]
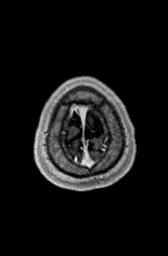
[im 160/160]
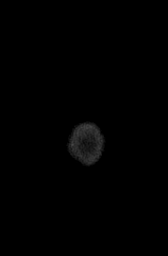

[Series 13: T1 post-contrast · coronal · 5.0mm · 0.43mm/px · 2 of 28 slices shown (2 of 2)]
[im 1/28]
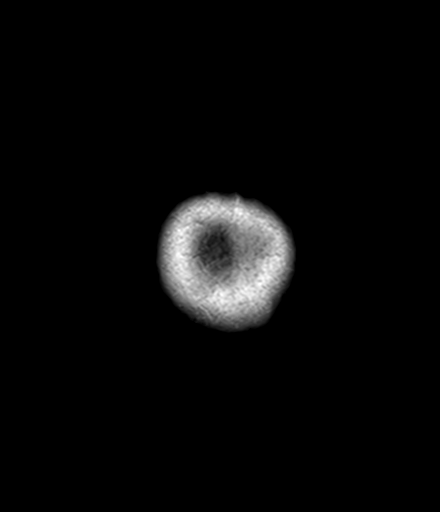
[im 28/28]
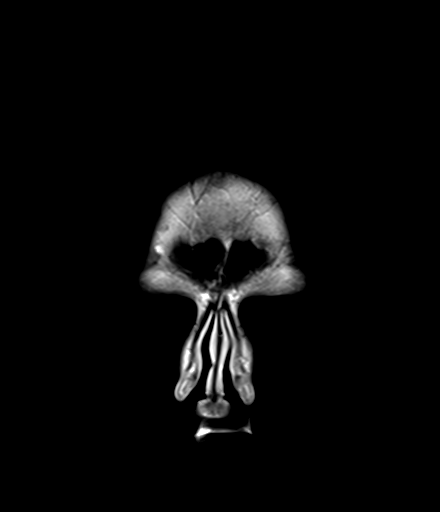

[Series 100: DWI · axial · 3.0mm · 1.20mm/px · z∈[-104,+58]mm · 4 of 55 slices shown (3 of 4)]
[im 1/55]
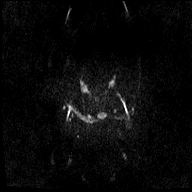
[im 19/55]
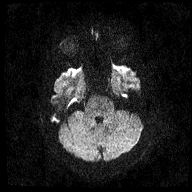
[im 37/55]
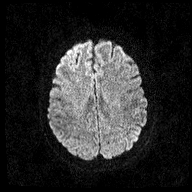
[im 55/55]
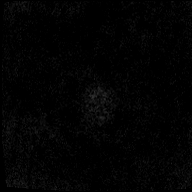

[Series 101: DWI · coronal · 3.0mm · 1.15mm/px · 3 of 45 slices shown (4 of 4)]
[im 1/45]
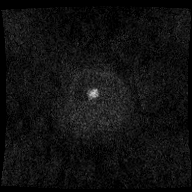
[im 23/45]
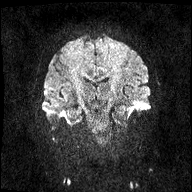
[im 45/45]
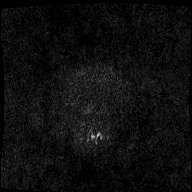

[48 of 48 positions shown; findings below may reference images not displayed]

FINDINGS: Brain: Seeming randomly distributed FLAIR hyperintensities in the
cerebral white matter numbering 20 or less. These could be from old
inflammation, trauma, or ischemia. There is no specific
demyelinating pattern. No abnormality in the brainstem, cisterns, or
visible labyrinth to explain dizziness. Negative cerebellum. No
infarct, hemorrhage, hydrocephalus, collection, or masslike finding.

Vascular: Major flow voids and vascular enhancements are preserved

Skull and upper cervical spine: Negative

Sinuses/Orbits: Negative
IMPRESSION: 1. No specific explanation for dizziness.
2. Small remote insults in the cerebral white matter to a mild
degree with nonspecific pattern.

## 2019-06-03 ENCOUNTER — Other Ambulatory Visit: Payer: Self-pay | Admitting: Student

## 2019-06-03 DIAGNOSIS — N644 Mastodynia: Secondary | ICD-10-CM

## 2019-06-03 DIAGNOSIS — N6310 Unspecified lump in the right breast, unspecified quadrant: Secondary | ICD-10-CM

## 2019-06-04 ENCOUNTER — Other Ambulatory Visit: Payer: Self-pay | Admitting: Student

## 2019-06-04 DIAGNOSIS — N6310 Unspecified lump in the right breast, unspecified quadrant: Secondary | ICD-10-CM

## 2019-06-04 DIAGNOSIS — N644 Mastodynia: Secondary | ICD-10-CM

## 2019-06-16 ENCOUNTER — Ambulatory Visit
Admission: RE | Admit: 2019-06-16 | Discharge: 2019-06-16 | Disposition: A | Discharge status: Home / Self Care | Payer: BC Managed Care – PPO | Source: Ambulatory Visit | Attending: Student | Admitting: Student

## 2019-06-16 DIAGNOSIS — N644 Mastodynia: Secondary | ICD-10-CM

## 2019-06-16 DIAGNOSIS — N6310 Unspecified lump in the right breast, unspecified quadrant: Secondary | ICD-10-CM

## 2019-07-16 ENCOUNTER — Encounter: Payer: Self-pay | Admitting: Plastic Surgery

## 2019-07-16 ENCOUNTER — Other Ambulatory Visit: Payer: Self-pay

## 2019-07-16 ENCOUNTER — Ambulatory Visit: Payer: BC Managed Care – PPO | Admitting: Plastic Surgery

## 2019-07-16 VITALS — BP 142/86 | HR 59 | Temp 97.4°F | Ht 61.0 in | Wt 126.0 lb

## 2019-07-16 DIAGNOSIS — N644 Mastodynia: Secondary | ICD-10-CM

## 2019-07-16 NOTE — Progress Notes (Signed)
Referring Provider Wayland Denis, PA-C [address] [place],  [place] [address]   CC: No chief complaint on file. Right breast pain  Janice Collier is an 59 y.o. female.  HPI: Patient presents with chronic right upper chest pain.  She had smooth saline submuscular breast implants placed 24 years ago.  About 12 years ago her right implant deflated and about 2 years ago her left implant deflated.  Over the past year or so she has noticed an increase in swelling and pain in the right side that is migrating superiorly.  She has fullness in the area that is mildly tender to touch.  She has had work-up with mammography which shows no concern for breast related pathology but they did see some fluid around the deflated implant in the subpectoral space.  The center here to discuss removal.  She is overall not physically that bothered by this and would prefer to avoid surgery if possible as she cannot afford to stop working.  Allergies  Allergen Reactions  . Baclofen Diarrhea and Itching  . Codeine Itching  . Hydrocodone Itching  . Prednisone Other (See Comments)    "felt drunk"  . Tramadol Itching    Outpatient Encounter Medications as of 07/16/2019  Medication Sig  . citalopram (CELEXA) 40 MG tablet Take by mouth.  . fluticasone (FLONASE) 50 MCG/ACT nasal spray Place into the nose.  . montelukast (SINGULAIR) 10 MG tablet TAKE 1 TABLET BY MOUTH EVERY DAY  . ASPIRIN LOW DOSE 81 MG EC tablet Take 81 mg by mouth daily.  Marland Kitchen atorvastatin (LIPITOR) 40 MG tablet Take 40 mg by mouth daily.  . cetirizine (ZYRTEC) 10 MG tablet Take by mouth.  . citalopram (CELEXA) 40 MG tablet Take 40 mg by mouth daily.  . cyclobenzaprine (FLEXERIL) 10 MG tablet Take 1 tablet (10 mg total) by mouth 3 (three) times daily as needed for muscle spasms. (Patient not taking: Reported on 07/16/2019)  . gabapentin (NEURONTIN) 100 MG capsule Take 1 capsule (100 mg total) by mouth 3 (three) times daily.    . hydrochlorothiazide (HYDRODIURIL) 25 MG tablet Take 1 tablet (25 mg total) by mouth daily. (Patient not taking: Reported on 07/16/2019)  . meclizine (ANTIVERT) 25 MG tablet Take 1 tablet (25 mg total) by mouth 3 (three) times daily as needed for dizziness or nausea. (Patient not taking: Reported on 07/16/2019)  . naproxen sodium (ALEVE) 220 MG tablet Take by mouth.  Marland Kitchen omeprazole (PRILOSEC) 20 MG capsule Take by mouth.  . ondansetron (ZOFRAN) 4 MG tablet Take 1 tablet (4 mg total) by mouth every 8 (eight) hours as needed for nausea or vomiting.  Marland Kitchen oxyCODONE-acetaminophen (ROXICET) 5-325 MG tablet Take 1-2 tablets by mouth every 4 (four) hours as needed for severe pain. (Patient not taking: Reported on 07/16/2019)  . pantoprazole (PROTONIX) 40 MG tablet Take 40 mg by mouth daily.  . RABEprazole (ACIPHEX) 20 MG tablet SMARTSIG:1 Pill By Mouth Every Morning   No facility-administered encounter medications on file as of 07/16/2019.     Past Medical History:  Diagnosis Date  . Hypertension     Family History  Problem Relation Age of Onset  . Breast cancer Neg Hx     Social History   Social History Narrative  . Not on file     Review of Systems General: Denies fevers, chills, weight loss CV: Denies chest pain, shortness of breath, palpitations  Physical Exam Vitals with BMI 07/16/2019 12/07/2017 12/07/2017  Height  5\' 1"  - -  Weight 126 lbs - -  BMI 23.82 - -  Systolic 142 148  Diastolic 86 91 106  Pulse 59 64 70    General:  No acute distress,  Alert and oriented, Non-Toxic, Normal speech and affect Breast: She has grade 3 ptosis.  She has an area of swelling and firmness in the upper pole on the right side at 6-8 cm in diameter.  There is no overlying skin changes.  I do not detect any masses in the axilla.  Assessment/Plan Patient presents with pain and swelling in the right side 24 years after breast augmentation.  I think the most likely explanation is capsular contracture  around the deflated right implant.  Given the fluid around the deflated implant and the mentioned of ALCL and the mammographers note I do not see a downside to aspirating the fluid and if there is enough, sending it to cytology.  Patient would prefer to avoid surgical remover of removal at this point if possible.  If she were to want surgery I would do bilateral removal of implants and total capsulectomy.  For the time being I will send her back to the [facility] clinic for a ultrasound-guided aspiration of the fluid.  She will stay in touch with [facility] regarding the results of that.  I spent over 45 minutes reviewing the patient's records, performing a face-to-face encounter, and coordinating her care.  [place] 07/16/2019, 12:40 PM

## 2019-07-29 ENCOUNTER — Other Ambulatory Visit: Payer: Self-pay | Admitting: Plastic Surgery

## 2019-07-29 DIAGNOSIS — R928 Other abnormal and inconclusive findings on diagnostic imaging of breast: Secondary | ICD-10-CM

## 2019-08-08 ENCOUNTER — Ambulatory Visit
Admission: RE | Admit: 2019-08-08 | Discharge: 2019-08-08 | Disposition: A | Discharge status: Home / Self Care | Payer: BC Managed Care – PPO | Source: Ambulatory Visit | Attending: Plastic Surgery | Admitting: Plastic Surgery

## 2019-08-08 DIAGNOSIS — R928 Other abnormal and inconclusive findings on diagnostic imaging of breast: Secondary | ICD-10-CM | POA: Diagnosis not present

## 2019-08-11 LAB — CYTOLOGY - NON PAP

## 2019-10-15 ENCOUNTER — Other Ambulatory Visit: Payer: Self-pay | Admitting: Student

## 2019-10-15 DIAGNOSIS — N63 Unspecified lump in unspecified breast: Secondary | ICD-10-CM

## 2019-10-15 DIAGNOSIS — Z9882 Breast implant status: Secondary | ICD-10-CM

## 2019-10-15 DIAGNOSIS — T8549XD Other mechanical complication of breast prosthesis and implant, subsequent encounter: Secondary | ICD-10-CM

## 2019-10-22 ENCOUNTER — Other Ambulatory Visit: Payer: Self-pay

## 2019-10-22 ENCOUNTER — Ambulatory Visit
Admission: RE | Admit: 2019-10-22 | Discharge: 2019-10-22 | Disposition: A | Discharge status: Home / Self Care | Payer: BC Managed Care – PPO | Source: Ambulatory Visit | Attending: Student | Admitting: Student

## 2019-10-22 DIAGNOSIS — N63 Unspecified lump in unspecified breast: Secondary | ICD-10-CM

## 2019-10-22 DIAGNOSIS — Z9882 Breast implant status: Secondary | ICD-10-CM | POA: Diagnosis not present

## 2019-10-22 DIAGNOSIS — T8549XD Other mechanical complication of breast prosthesis and implant, subsequent encounter: Secondary | ICD-10-CM

## 2019-11-12 ENCOUNTER — Other Ambulatory Visit: Payer: Self-pay

## 2019-11-12 ENCOUNTER — Ambulatory Visit (INDEPENDENT_AMBULATORY_CARE_PROVIDER_SITE_OTHER): Payer: BC Managed Care – PPO | Admitting: Plastic Surgery

## 2019-11-12 ENCOUNTER — Encounter: Payer: Self-pay | Admitting: Plastic Surgery

## 2019-11-12 VITALS — BP 139/83 | HR 61 | Temp 98.1°F

## 2019-11-12 DIAGNOSIS — T8544XA Capsular contracture of breast implant, initial encounter: Secondary | ICD-10-CM

## 2019-11-12 NOTE — Progress Notes (Signed)
   Referring Provider Carren Rang, PA-C [address] [place],  [place] [address]   CC:  Chief Complaint  Patient presents with  . Follow-up      Janice Collier is an 59 y.o. female.  HPI: Patient presents in follow-up with concerns over her breast implants.  She had an augmentation many years ago and she believes this was with saline implants.  She developed some swelling and firmness on the right side and subsequently had this aspirated and 75 cc of fluid were removed.  This was negative for any malignancy and had the appearance of a liquefied hematoma.  She believes the fluid has reaccumulated and the pain and swelling in the right side continues to bother her.  She would like to have both her implants removed.  Review of Systems General: Denies fevers or chills  Physical Exam Vitals with BMI 11/12/2019 07/16/2019 12/07/2017  Height - 5\' 1"  -  Weight - 126 lbs -  BMI - 23.82 -  Systolic 139 142  Diastolic 83 86 91  Pulse 61 59 64    General:  No acute distress,  Alert and oriented, Non-Toxic, Normal speech and affect Examination is fairly consistent with her last visit.  She has the clinical appearance of capsular contracture on the right side with a high riding implant and firmness in that area.  I cannot say for sure if there is any subcutaneous fluid.  The left side seems soft.  She has bilateral inframammary incisions.  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  I recommended excision of both implants with total capsulectomy.  I explained the details of this procedure to her along with the risk of benefits.  We discussed the risk include bleeding, infection, damage surrounding structures, need for additional procedures.  We discussed the potential for reaccumulation of the fluid although I would use drains and surgery.  All her  questions were answered and we will plan to get this scheduled for her.  465 11/12/2019, 12:20 PM

## 2019-11-26 ENCOUNTER — Telehealth: Payer: Self-pay | Admitting: Plastic Surgery

## 2019-11-26 NOTE — Telephone Encounter (Signed)
Received voicemail from Ms. Tallo requesting a call back. I returned her call, she was wanting to know if Dr. Arita Miss would aspirate the fluid above the ruptured implant. I explained that Dr. Arita Miss stated that radiology would need to aspirate the fluid, as was previously done during the ultrasound. I reiterated that [employer] policy states that the "Removal of the implant is not covered when the original implant surgery was primarily for cosmetic reasons or other non-covered indications." If the area that she is wanting aspirated or drained is not related to her implant, she could consult with a general surgeon for evaluation, since radiology is no longer willing to aspirate the fluid. Patient expressed understanding and will contact general surgery for a consultation.

## 2019-12-23 ENCOUNTER — Telehealth: Payer: Self-pay | Admitting: Plastic Surgery

## 2019-12-23 NOTE — Telephone Encounter (Signed)
Called patient after receiving a message that the patient wanted to have us resubmit the removal of ruptured implants to insurance for coverage. The [employer]BS guidelines state, "Removal of breast implants is covered when it is medically necessary due to complications from an implantation for a covered indication. Removal of the implant is not covered when the original implant surgery was primarily for cosmetic reasons or other non-covered indications." I advised the patient that [employer]BS indicated no authorization was required for the requested surgery, but that we must follow medical policy guidelines. Based on this information, I do not feel that the medical policy guidelines have been met, as the implants were placed many years ago for cosmetic reasons, have been ruptured for quite some time, and the fluid collection that has been aspirated does not suggest malignancy or breast disease. If the patient chooses to proceed with this information, and the claim is denied after the fact, she would be responsible for the entire billed amount. The only way to control the out-of-pocket cost is to proceed with the self-pay option, which was previously provided to the patient. Janice Collier expresses understanding and chooses to wait until a later time. 

## 2020-10-22 IMAGING — MG US ASPIRATION RIGHT BREAST
1 series · 5 of 5 positions shown · non-contrast
Comparison: Previous exams.
COMPARISON: Previous exams.

Addendum:
CLINICAL DATA: 59-year-old female for aspiration of fluid adjacent
to a collapsed retropectoral RIGHT breast implant. History of
swelling and fluid accumulation following trauma.

EXAM:
ULTRASOUND GUIDED RIGHT BREAST CYST ASPIRATION

[Series 1: MG view · 0.09mm/px · 5 of 5 slices shown]
[im 1/5]
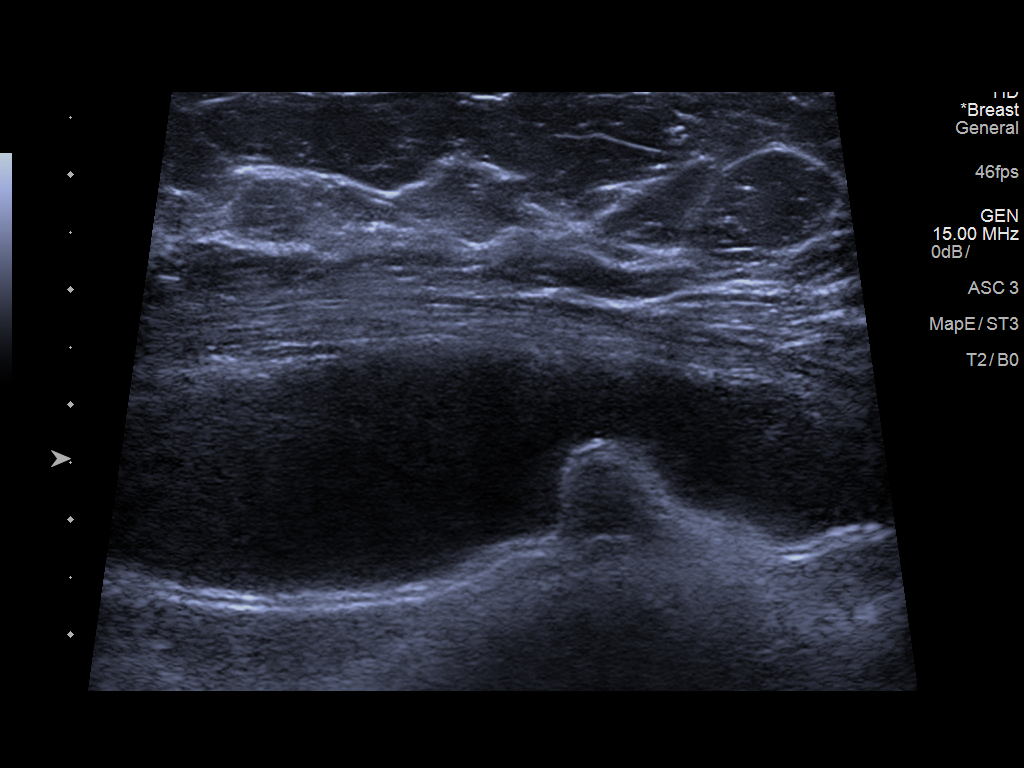
[im 2/5]
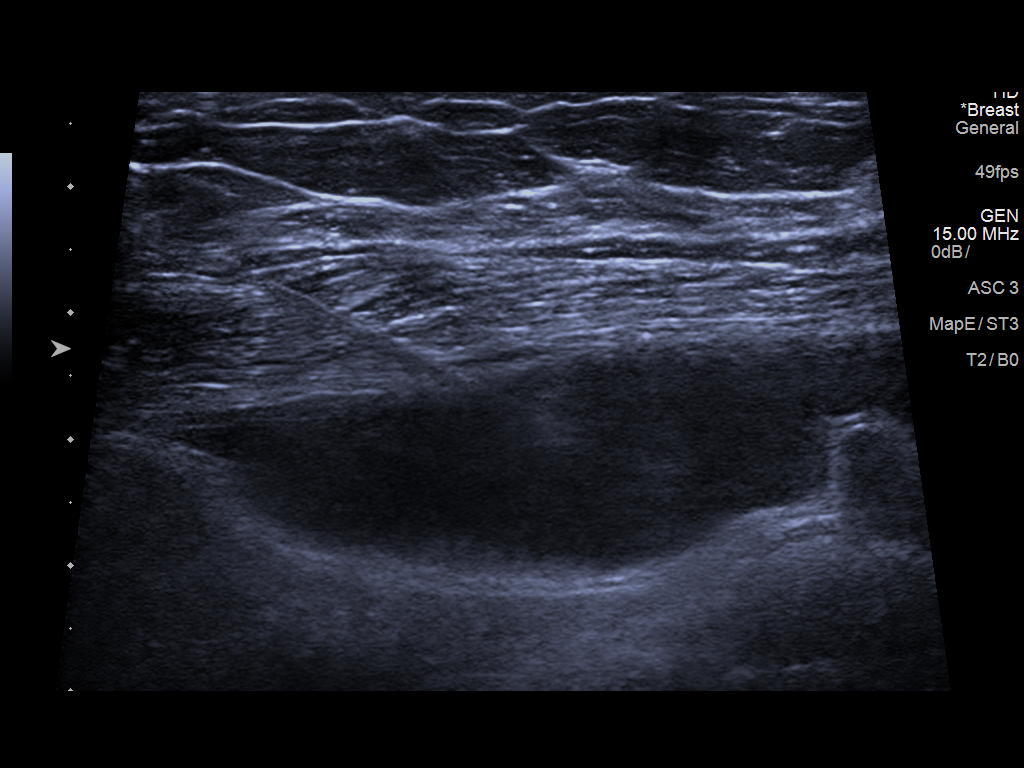
[im 3/5]
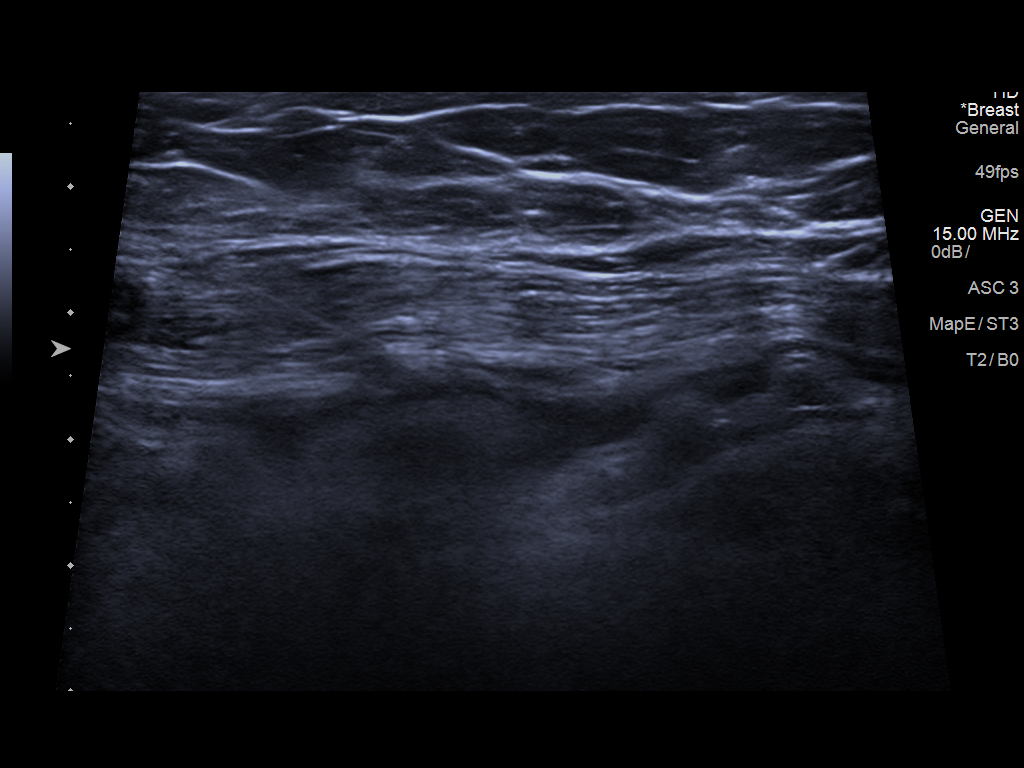
[im 4/5]
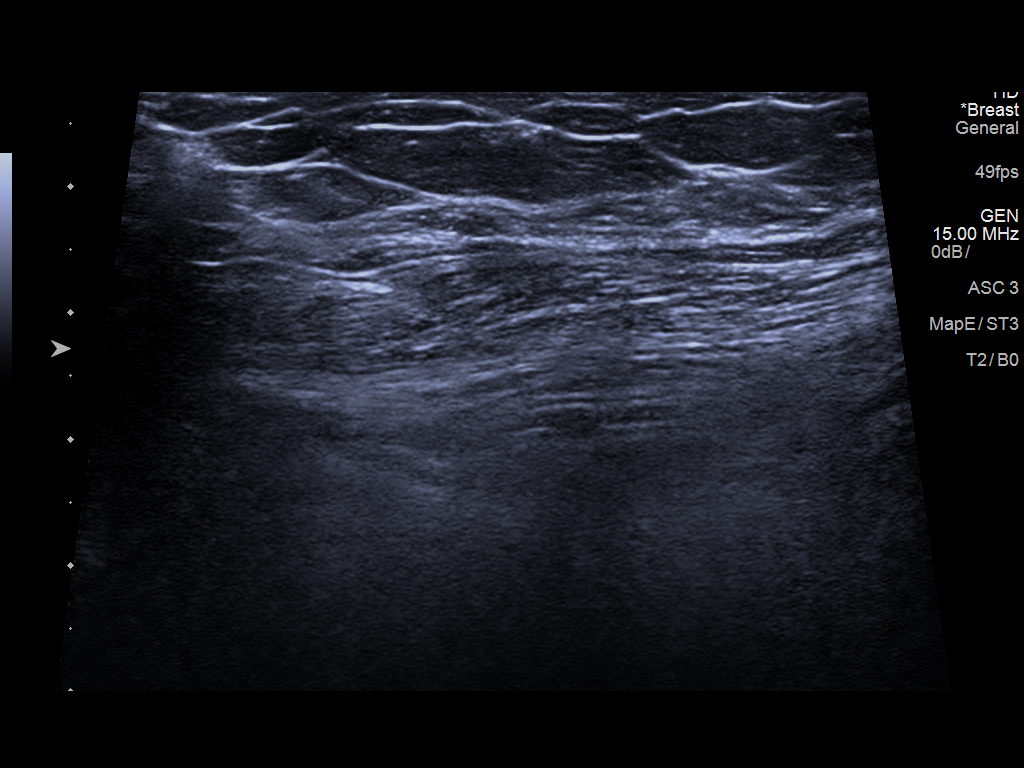
[im 5/5]
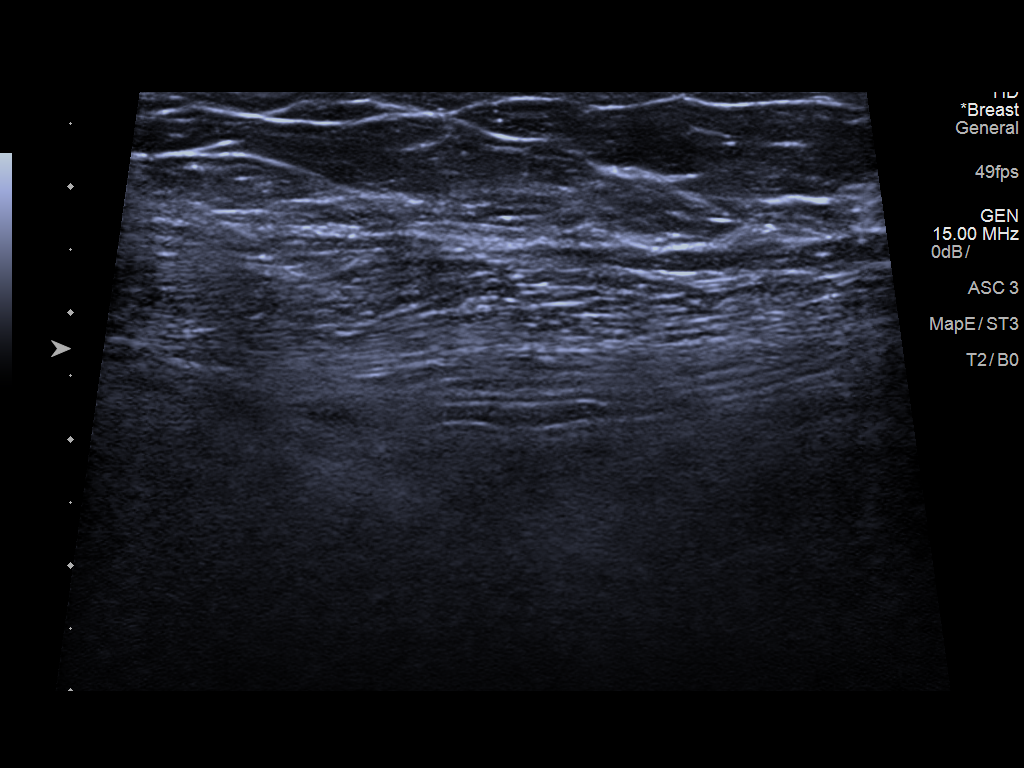

[5 of 5 positions shown; findings below may reference images not displayed]

PROCEDURE:
Using sterile technique, 1% lidocaine, under direct ultrasound
visualization with a 20 gauge needle, aspiration of fluid adjacent
to the collapsed retropectoral RIGHT implant was performed.

75 cc's of dark Kamat fluid (appearance of old blood) was aspirated
with a portion sent to cytology for further evaluation. Near
complete aspiration of the peri-implant fluid was identified.
IMPRESSION: Ultrasound-guided aspiration of fluid adjacent to the collapsed
RIGHT retropectoral implant with near complete aspiration of fluid.
A portion of the fluid was sent to cytology. No apparent
complications.

RECOMMENDATIONS:
Clinical follow-up.

ADDENDUM:
Breast aspiration yielded: A. FLUID ADJACENT TO RETROPECTORAL RIGHT
BREAST IMPLANT; ASPIRATION: - NEGATIVE FOR MALIGNANCY. - LOW
CELLULARITY FLUID CONTAINING DEGENERATING RBCS, MACROPHAGES, AND A
FEW SMALL LYMPHOCYTES AND NEUTROPHILS.

Results are CONCORDANT with imaging findings, per Dr. Richi Ravelo.

Spoke with patient via telephone to assess aspiration site, which
was found to be within normal limits on 08/12/2019. All questions were
answered and patient was instructed to call [HOSPITAL]
for any additional questions or concerns related to aspiration site.

Patient was instructed to follow up with provider and plastic
surgeon Dr. Angele Glasper for further treatment and resolution.
Called patient's provider office to ensure they have received
laboratory results. Any changes in current treatment will be managed
by provider.

Addendum by Xoona Zoonty RN on 08/13/2019.

*** End of Addendum ***
PROCEDURE:
Using sterile technique, 1% lidocaine, under direct ultrasound
visualization with a 20 gauge needle, aspiration of fluid adjacent
to the collapsed retropectoral RIGHT implant was performed.

75 cc's of dark Kamat fluid (appearance of old blood) was aspirated
with a portion sent to cytology for further evaluation. Near
complete aspiration of the peri-implant fluid was identified.
IMPRESSION: Ultrasound-guided aspiration of fluid adjacent to the collapsed
RIGHT retropectoral implant with near complete aspiration of fluid.
A portion of the fluid was sent to cytology. No apparent
complications.

RECOMMENDATIONS:
Clinical follow-up.

## 2020-12-13 ENCOUNTER — Ambulatory Visit
Admission: RE | Admit: 2020-12-13 | Discharge: 2020-12-13 | Disposition: A | Discharge status: Home / Self Care | Payer: BC Managed Care – PPO | Source: Ambulatory Visit | Attending: Student | Admitting: Student

## 2020-12-13 ENCOUNTER — Other Ambulatory Visit: Payer: Self-pay | Admitting: Student

## 2020-12-13 ENCOUNTER — Other Ambulatory Visit: Payer: Self-pay

## 2020-12-13 DIAGNOSIS — Z1231 Encounter for screening mammogram for malignant neoplasm of breast: Secondary | ICD-10-CM
